# Patient Record
Sex: Male | Born: 1995
Health system: Western US, Academic
[De-identification: ages and names within clinical notes are randomized; demographics above are authoritative.]

## PROBLEM LIST (undated history)

## (undated) HISTORY — PX: APPENDECTOMY: SHX54

---
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
## 2020-02-18 LAB — HIV 1/2 ANTIBODY/ANTIGEN SCREEN - EXTERNAL: HIV 1+2 AB+HIV1P24 AG, CLA: NONREACTIVE

## 2020-02-18 LAB — GENERIC EXTERNAL RESULT (UNMAPPED): HGBA1C%: 5.6 % (ref 4.6–5.6)

## 2020-06-27 ENCOUNTER — Ambulatory Visit: Payer: Self-pay | Admitting: Physician Assistant

## 2020-09-29 ENCOUNTER — Ambulatory Visit: Payer: Self-pay | Attending: Occupational Therapy

## 2020-09-29 DIAGNOSIS — Z1159 Encounter for screening for other viral diseases: Secondary | ICD-10-CM | POA: Insufficient documentation

## 2020-09-29 DIAGNOSIS — Z20822 Contact with and (suspected) exposure to covid-19: Secondary | ICD-10-CM | POA: Insufficient documentation

## 2020-09-29 LAB — COVID-19, FLU A/B PANEL (POC)
COVID-19 Result: NOT DETECTED
Influenza A, PCR: NOT DETECTED
Influenza B, PCR: NOT DETECTED
Respiratory Virus Comment: NOT DETECTED

## 2020-09-29 NOTE — Interdisciplinary (Signed)
Keith Mcbride was screened for Asymptomatic covid-19 with intention to visit admitted patient.

## 2020-09-30 ENCOUNTER — Telehealth: Payer: Self-pay | Admitting: Occupational Therapy

## 2020-09-30 NOTE — Telephone Encounter (Signed)
Keith Mcbride contacted with Covid-19 screening result.

## 2022-10-21 ENCOUNTER — Ambulatory Visit
Admission: RE | Admit: 2022-10-21 | Discharge: 2022-10-21 | Disposition: A | Discharge status: Home / Self Care | Payer: Self-pay | Source: Ambulatory Visit | Attending: Adult Health | Admitting: Adult Health

## 2022-10-21 ENCOUNTER — Other Ambulatory Visit: Payer: Self-pay | Admitting: Adult Health

## 2022-10-21 DIAGNOSIS — R319 Hematuria, unspecified: Secondary | ICD-10-CM | POA: Insufficient documentation

## 2023-07-18 ENCOUNTER — Other Ambulatory Visit: Payer: Self-pay

## 2023-07-18 ENCOUNTER — Encounter (HOSPITAL_COMMUNITY): Payer: Self-pay | Admitting: Emergency Medicine

## 2023-07-18 ENCOUNTER — Emergency Department (HOSPITAL_COMMUNITY)
Admission: EM | Admit: 2023-07-18 | Discharge: 2023-07-18 | Disposition: A | Discharge status: Home / Self Care | Attending: Emergency Medicine | Admitting: Emergency Medicine

## 2023-07-18 ENCOUNTER — Emergency Department (HOSPITAL_COMMUNITY)

## 2023-07-18 DIAGNOSIS — J101 Influenza due to other identified influenza virus with other respiratory manifestations: Secondary | ICD-10-CM | POA: Diagnosis not present

## 2023-07-18 DIAGNOSIS — R059 Cough, unspecified: Secondary | ICD-10-CM | POA: Diagnosis present

## 2023-07-18 LAB — RESP PANEL BY RT-PCR (RSV, FLU A&B, COVID)  RVPGX2
Influenza A by PCR: POSITIVE — AB
Influenza B by PCR: NEGATIVE
Resp Syncytial Virus by PCR: NEGATIVE
SARS Coronavirus 2 by RT PCR: NEGATIVE

## 2023-07-18 MED ORDER — PSEUDOEPHEDRINE HCL 60 MG PO TABS
60.0000 mg | ORAL_TABLET | Freq: Once | ORAL | Status: AC
  Administered 2023-07-18: 60 mg via ORAL
  Filled 2023-07-18: qty 1

## 2023-07-18 MED ORDER — HYDROCODONE BIT-HOMATROP MBR 5-1.5 MG/5ML PO SOLN
5.0000 mL | Freq: Once | ORAL | Status: AC
  Administered 2023-07-18: 5 mL via ORAL
  Filled 2023-07-18: qty 5
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
## 2023-07-18 NOTE — ED Triage Notes (Signed)
 Pt with c/o cough and congestion since Thursday.
